# Patient Record
Sex: Male | Born: 1975 | Race: White | Hispanic: No | Smoking: Current every day smoker
Health system: Southern US, Community
[De-identification: ages and names within clinical notes are randomized; demographics above are authoritative.]

## PROBLEM LIST (undated history)

## (undated) DIAGNOSIS — E119 Type 2 diabetes mellitus without complications: Secondary | ICD-10-CM

## (undated) DIAGNOSIS — F419 Anxiety disorder, unspecified: Secondary | ICD-10-CM

## (undated) DIAGNOSIS — E785 Hyperlipidemia, unspecified: Secondary | ICD-10-CM

## (undated) HISTORY — DX: Anxiety disorder, unspecified: F41.9

## (undated) HISTORY — DX: Hyperlipidemia, unspecified: E78.5

## (undated) HISTORY — DX: Type 2 diabetes mellitus without complications: E11.9

---

## 2003-07-30 ENCOUNTER — Emergency Department (HOSPITAL_COMMUNITY): Admission: EM | Admit: 2003-07-30 | Discharge: 2003-07-30 | Payer: Self-pay | Admitting: Emergency Medicine

## 2008-06-08 ENCOUNTER — Ambulatory Visit: Payer: Self-pay | Admitting: Family Medicine

## 2008-06-08 DIAGNOSIS — J069 Acute upper respiratory infection, unspecified: Secondary | ICD-10-CM | POA: Insufficient documentation

## 2008-10-29 ENCOUNTER — Encounter: Admission: RE | Admit: 2008-10-29 | Discharge: 2008-10-29 | Payer: Self-pay | Admitting: Family Medicine

## 2008-11-03 ENCOUNTER — Encounter: Admission: RE | Admit: 2008-11-03 | Discharge: 2008-11-03 | Payer: Self-pay | Admitting: Family Medicine

## 2010-01-22 IMAGING — CT CT ABDOMEN W/ CM
3 of 4 series · 13 of 36 positions shown, 19 images · IV contrast (READICAT/WATER & [ID] OMNI 300)
Comparison: No prior CT.  Abdominal ultrasound 10/29/2008.

CLINICAL DATA: Elevated liver function tests, including
transaminases and bilirubin.  Evaluate for obstructive lesion.

CT ABDOMEN WITH CONTRAST  11/03/2008:
TECHNIQUE: Multidetector CT imaging of the abdomen was performed
using the standard protocol following bolus administration of
intravenous contrast.
Contrast: 125 ml Umnipaque-BWW IV.  Dilute Omnipaque as oral
contrast.

[Series 3: routine abdomen · axial · 0.83mm/px · z∈[-272,-42]mm · 6 of 63 slices shown, 11 images]
[im 9/63  soft-tissue]
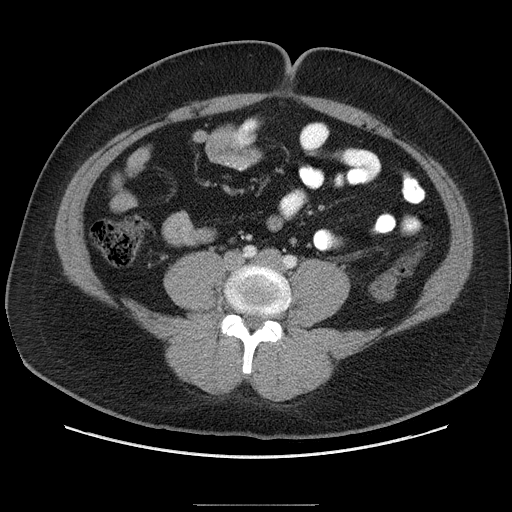
[im 9/63  bone]
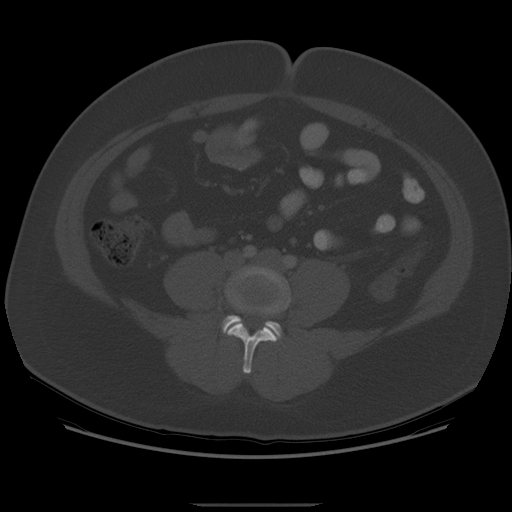
[im 18/63  soft-tissue]
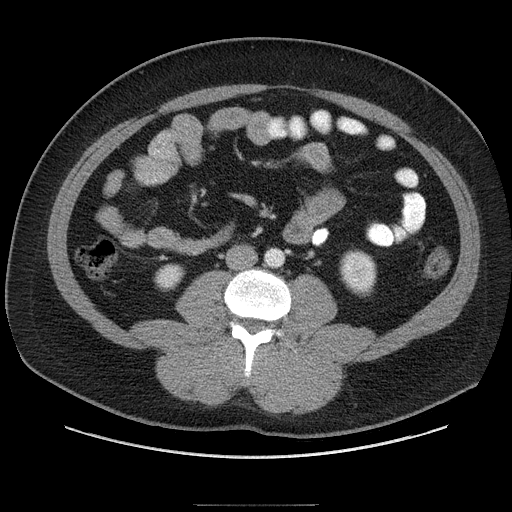
[im 27/63  soft-tissue]
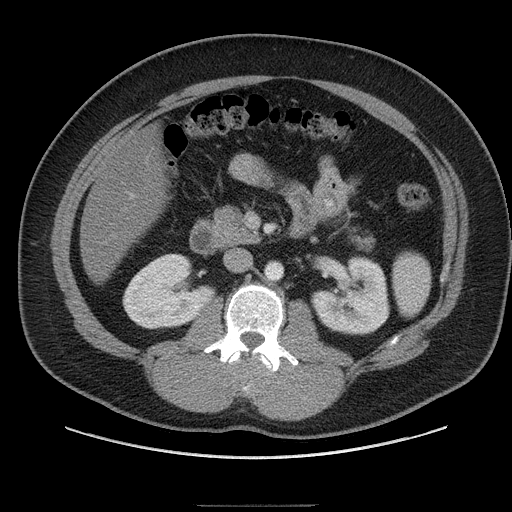
[im 27/63  lung]
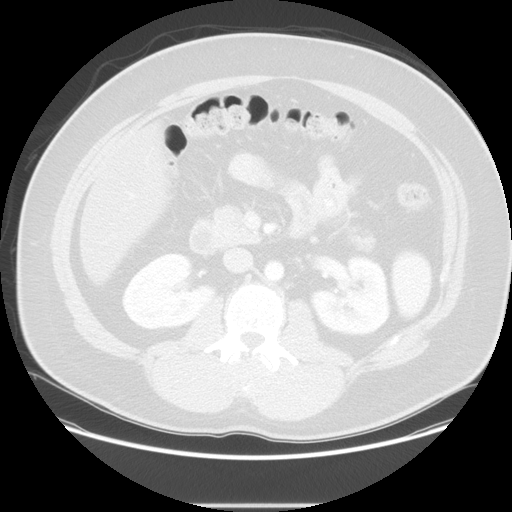
[im 36/63  soft-tissue]
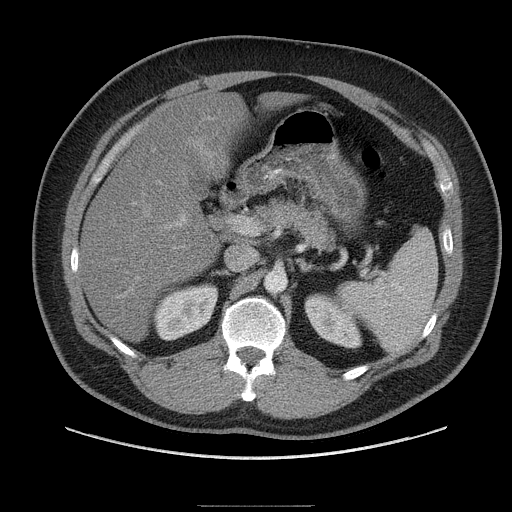
[im 36/63  lung]
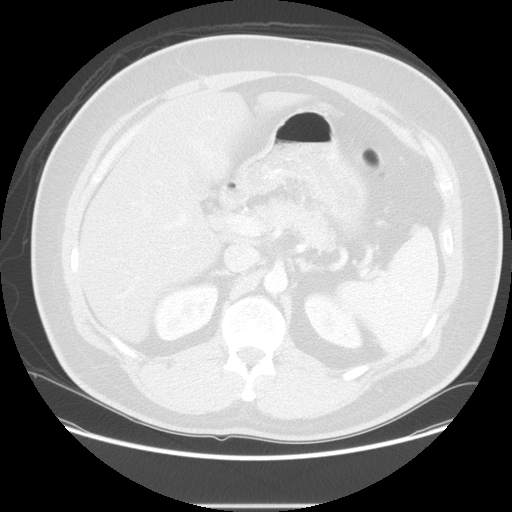
[im 45/63  soft-tissue]
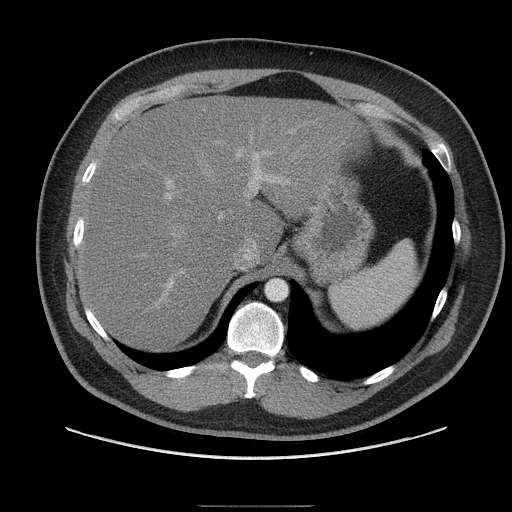
[im 45/63  lung]
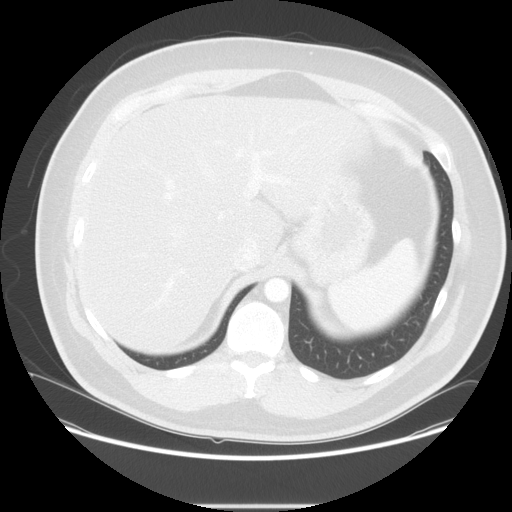
[im 54/63  soft-tissue]
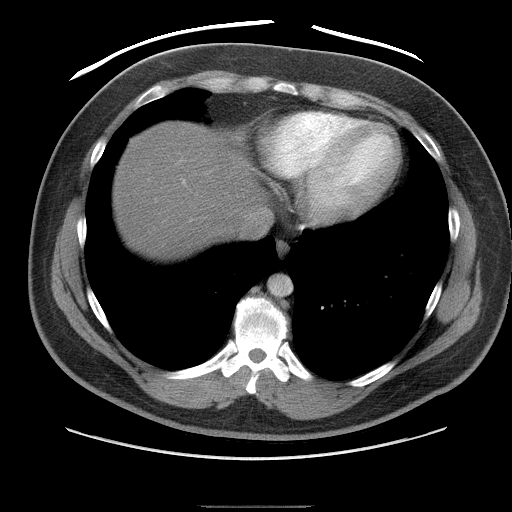
[im 54/63  lung]
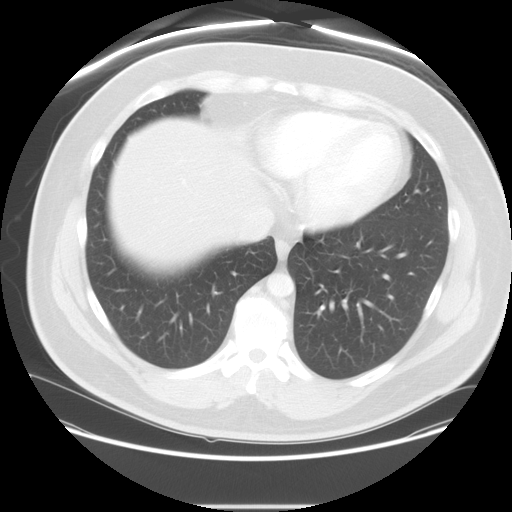

[Series 601: coronal body · coronal · 0.83mm/px · 1 of 139 slices shown, 2 images]
[im 47/139  soft-tissue]
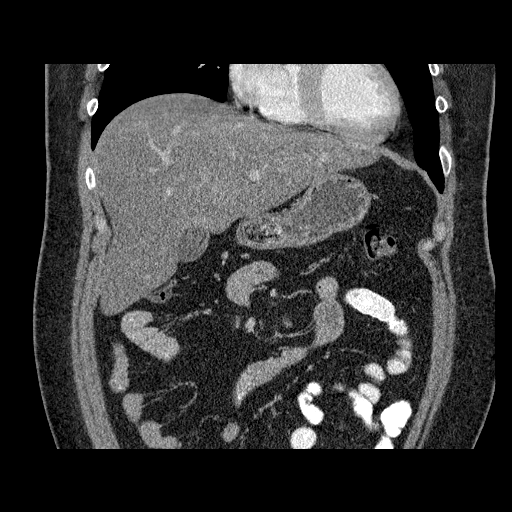
[im 47/139  bone]
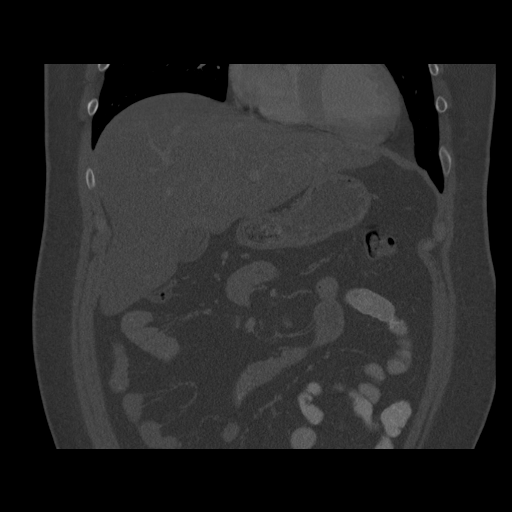

[Series 602: sagittal body · sagittal · 0.83mm/px · 6 of 170 slices shown]
[im 17/170  soft-tissue]
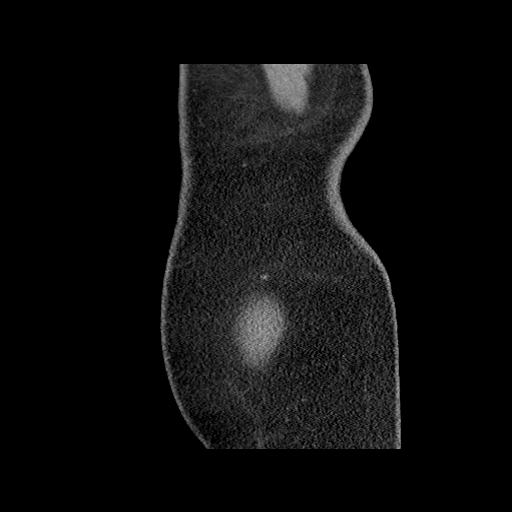
[im 34/170  soft-tissue]
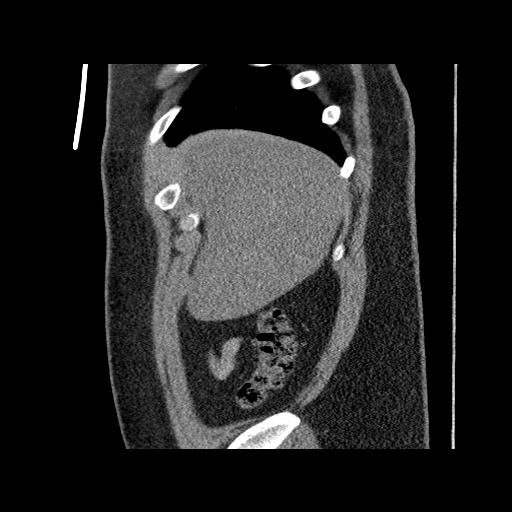
[im 51/170  soft-tissue]
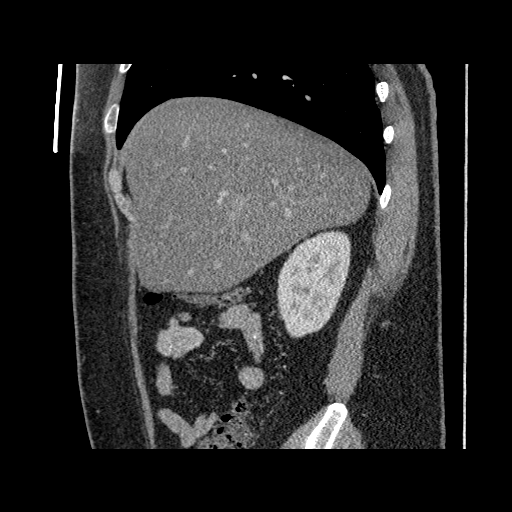
[im 77/170  soft-tissue]
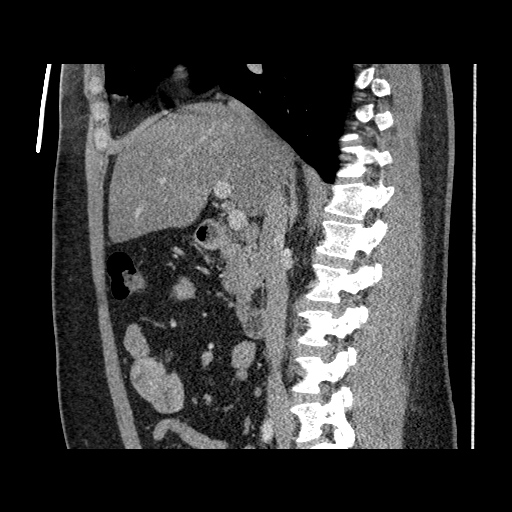
[im 93/170  soft-tissue]
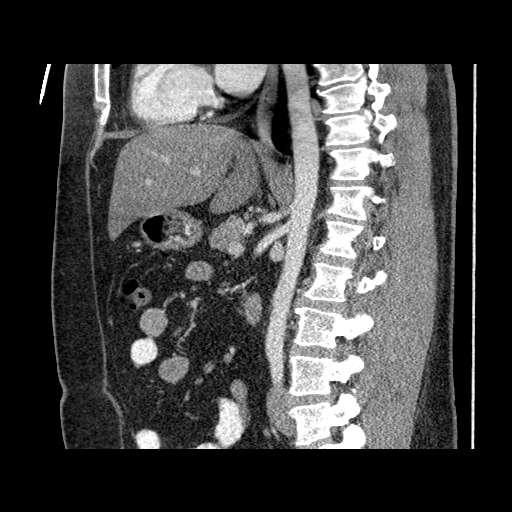
[im 119/170  soft-tissue]
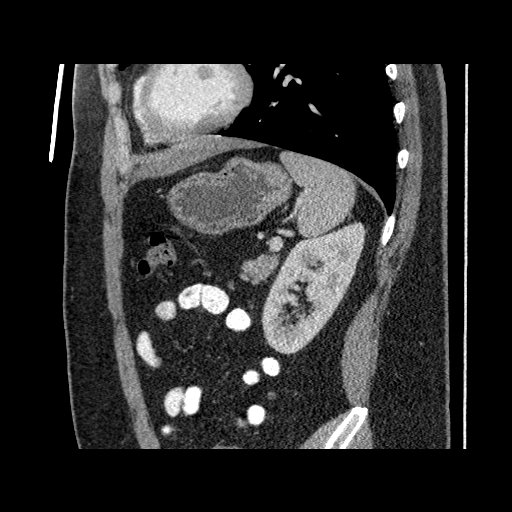

[13 of 36 positions shown; findings below may reference images not displayed]

FINDINGS: Diffuse fatty infiltration of the liver with focal
sparing surrounding the gallbladder.  No focal hepatic parenchymal
abnormality.  No intra or extrahepatic biliary ductal dilation.
Normal-appearing gallbladder by CT.  Normal appearing pancreas.
Normal spleen with focus of accessory splenic tissue medial to the
lower pole.  Normal adrenal glands and kidneys.  Stomach and
visualized small bowel and colon unremarkable.  Normal appearing
abdominal aorta with widely patent visceral arteries.  2 left renal
arteries noted.  No significant lymphadenopathy.  No ascites.
Visualized lung bases clear.  Bone window images demonstrate
congenital Marco-Love disease involving the thoracic spine but no
significant osseous abnormalities.
IMPRESSION: 1.  Diffuse fatty infiltration of the liver without focal hepatic
parenchymal abnormalities.
2.  No evidence of intra or extrahepatic biliary ductal dilation.
3.  Normal appearing pancreas.
4.  Normal CT abdomen otherwise.

## 2013-10-15 ENCOUNTER — Encounter: Payer: BC Managed Care – PPO | Attending: Internal Medicine

## 2013-10-15 VITALS — Ht 70.0 in | Wt 228.0 lb

## 2013-10-15 DIAGNOSIS — Z713 Dietary counseling and surveillance: Secondary | ICD-10-CM | POA: Insufficient documentation

## 2013-10-15 DIAGNOSIS — E119 Type 2 diabetes mellitus without complications: Secondary | ICD-10-CM

## 2013-10-15 NOTE — Progress Notes (Signed)
Patient was seen on 10/15/13 for the first of a series of three diabetes self-management courses at the [facility].  Patient Education Plan per assessed needs and concerns is to attend four course education program for Diabetes Self Management Education.  Current HbA1c: 10%  The following learning objectives were met by the patient during this class:  Describe diabetes  State some common risk factors for diabetes  Defines the role of glucose and insulin  Identifies type of diabetes and pathophysiology  Describe the relationship between diabetes and cardiovascular risk  State the members of the Healthcare Team  States the rationale for glucose monitoring  State when to test glucose  State their individual Target Range  State the importance of logging glucose readings  Describe how to interpret glucose readings  Identifies A1C target  Explain the correlation between A1c and eAG values  State symptoms and treatment of high blood glucose  State symptoms and treatment of low blood glucose  Explain proper technique for glucose testing  Identifies proper sharps disposal  Handouts given during class include:  Living Well with Diabetes book  Carb Counting and Meal Planning book  Meal Plan Card  Carbohydrate guide  Meal planning worksheet  Low Sodium Flavoring Tips  The diabetes portion plate  G7F to eAG Conversion Chart  Diabetes Medications  Diabetes Recommended Care Schedule  Support Group  Diabetes Success Plan  Core Class Satisfaction Survey  Follow-Up Plan:  Attend core 2

## 2013-10-22 ENCOUNTER — Encounter: Payer: BC Managed Care – PPO | Attending: Internal Medicine

## 2013-10-22 DIAGNOSIS — Z713 Dietary counseling and surveillance: Secondary | ICD-10-CM | POA: Insufficient documentation

## 2013-10-22 DIAGNOSIS — E119 Type 2 diabetes mellitus without complications: Secondary | ICD-10-CM | POA: Insufficient documentation

## 2013-10-22 NOTE — Progress Notes (Signed)

## 2013-10-29 ENCOUNTER — Ambulatory Visit: Payer: BC Managed Care – PPO

## 2022-05-16 ENCOUNTER — Ambulatory Visit (INDEPENDENT_AMBULATORY_CARE_PROVIDER_SITE_OTHER): Payer: BC Managed Care – PPO | Admitting: Sports Medicine

## 2022-05-16 VITALS — BP 103/71 | HR 72 | Ht 70.0 in | Wt 207.0 lb

## 2022-05-16 DIAGNOSIS — Z0289 Encounter for other administrative examinations: Secondary | ICD-10-CM | POA: Diagnosis not present

## 2022-05-16 LAB — POCT URINALYSIS DIP (CLINITEK)
Bilirubin, UA: NEGATIVE
Blood, UA: NEGATIVE
Glucose, UA: NEGATIVE mg/dL
Ketones, POC UA: NEGATIVE mg/dL
Leukocytes, UA: NEGATIVE
Nitrite, UA: NEGATIVE
POC PROTEIN,UA: NEGATIVE
Spec Grav, UA: 1.015
Urobilinogen, UA: 4 U/dL — AB
pH, UA: 7

## 2022-05-16 NOTE — Progress Notes (Signed)
    Procedures performed today:    None.  Independent interpretation of notes and tests performed by another provider:   None.  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  He is also on an SSRI for anxiety, this will likely require coming off of the SSRI for 60 days followed by a letter from the treating provider that his mood is stable.  Once these documents are available I can upload them to the [facility] system and they will process the referral and hopefully issue his certificate.    ____________________________________________ Gwen Her. Dianah Field, M.D., ABFM., CAQSM., AME. Primary Care and Sports Medicine [facility]  Adjunct Professor of [employer]  Risk manager

## 2022-05-16 NOTE — Assessment & Plan Note (Signed)
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.  He is also on an SSRI for anxiety, this will likely require coming off of the SSRI for 60 days followed by a letter from the treating provider that his mood is stable.  Once these documents are available I can upload them to the [facility] system and they will process the referral and hopefully issue his certificate.

## 2022-05-17 ENCOUNTER — Encounter: Payer: Self-pay | Admitting: Sports Medicine

## 2022-07-27 ENCOUNTER — Encounter: Payer: Self-pay | Admitting: Sports Medicine

## 2023-11-21 ENCOUNTER — Encounter: Payer: Self-pay | Admitting: Sports Medicine
